# Patient Record
Sex: Female | Born: 1975 | Race: White | Hispanic: No | Marital: Married | Smoking: Never smoker
Health system: Southern US, Community
[De-identification: ages and names within clinical notes are randomized; demographics above are authoritative.]

## PROBLEM LIST (undated history)

## (undated) DIAGNOSIS — R112 Nausea with vomiting, unspecified: Secondary | ICD-10-CM

## (undated) DIAGNOSIS — F329 Major depressive disorder, single episode, unspecified: Secondary | ICD-10-CM

## (undated) DIAGNOSIS — F909 Attention-deficit hyperactivity disorder, unspecified type: Secondary | ICD-10-CM

## (undated) DIAGNOSIS — G43909 Migraine, unspecified, not intractable, without status migrainosus: Secondary | ICD-10-CM

## (undated) DIAGNOSIS — J45909 Unspecified asthma, uncomplicated: Secondary | ICD-10-CM

## (undated) DIAGNOSIS — F419 Anxiety disorder, unspecified: Secondary | ICD-10-CM

## (undated) DIAGNOSIS — Z9889 Other specified postprocedural states: Secondary | ICD-10-CM

## (undated) DIAGNOSIS — F32A Depression, unspecified: Secondary | ICD-10-CM

## (undated) DIAGNOSIS — F319 Bipolar disorder, unspecified: Secondary | ICD-10-CM

## (undated) HISTORY — PX: BREAST SURGERY: SHX581

## (undated) HISTORY — PX: CHOLECYSTECTOMY: SHX55

---

## 2012-01-22 ENCOUNTER — Emergency Department (HOSPITAL_COMMUNITY)
Admission: EM | Admit: 2012-01-22 | Discharge: 2012-01-22 | Disposition: A | Discharge status: Home / Self Care | Payer: BC Managed Care – PPO | Attending: Emergency Medicine | Admitting: Emergency Medicine

## 2012-01-22 ENCOUNTER — Emergency Department (HOSPITAL_COMMUNITY): Payer: BC Managed Care – PPO

## 2012-01-22 ENCOUNTER — Encounter (HOSPITAL_COMMUNITY): Payer: Self-pay | Admitting: *Deleted

## 2012-01-22 DIAGNOSIS — B9789 Other viral agents as the cause of diseases classified elsewhere: Secondary | ICD-10-CM | POA: Insufficient documentation

## 2012-01-22 DIAGNOSIS — Z8679 Personal history of other diseases of the circulatory system: Secondary | ICD-10-CM | POA: Insufficient documentation

## 2012-01-22 DIAGNOSIS — IMO0001 Reserved for inherently not codable concepts without codable children: Secondary | ICD-10-CM | POA: Insufficient documentation

## 2012-01-22 DIAGNOSIS — B349 Viral infection, unspecified: Secondary | ICD-10-CM

## 2012-01-22 DIAGNOSIS — Z79899 Other long term (current) drug therapy: Secondary | ICD-10-CM | POA: Insufficient documentation

## 2012-01-22 DIAGNOSIS — R55 Syncope and collapse: Secondary | ICD-10-CM | POA: Insufficient documentation

## 2012-01-22 DIAGNOSIS — Z8659 Personal history of other mental and behavioral disorders: Secondary | ICD-10-CM | POA: Insufficient documentation

## 2012-01-22 DIAGNOSIS — R404 Transient alteration of awareness: Secondary | ICD-10-CM | POA: Insufficient documentation

## 2012-01-22 DIAGNOSIS — R42 Dizziness and giddiness: Secondary | ICD-10-CM | POA: Insufficient documentation

## 2012-01-22 DIAGNOSIS — Z3202 Encounter for pregnancy test, result negative: Secondary | ICD-10-CM | POA: Insufficient documentation

## 2012-01-22 HISTORY — DX: Major depressive disorder, single episode, unspecified: F32.9

## 2012-01-22 HISTORY — DX: Migraine, unspecified, not intractable, without status migrainosus: G43.909

## 2012-01-22 HISTORY — DX: Depression, unspecified: F32.A

## 2012-01-22 HISTORY — DX: Anxiety disorder, unspecified: F41.9

## 2012-01-22 LAB — COMPREHENSIVE METABOLIC PANEL
ALT: 20 U/L (ref 0–35)
AST: 17 U/L (ref 0–37)
Albumin: 3.4 g/dL — ABNORMAL LOW (ref 3.5–5.2)
Alkaline Phosphatase: 36 U/L — ABNORMAL LOW (ref 39–117)
BUN: 8 mg/dL (ref 6–23)
CO2: 22 mEq/L (ref 19–32)
Calcium: 8.9 mg/dL (ref 8.4–10.5)
Chloride: 106 mEq/L (ref 96–112)
Creatinine, Ser: 0.75 mg/dL (ref 0.50–1.10)
GFR calc Af Amer: >90 mL/min (ref >90)
GFR calc non Af Amer: >90 mL/min (ref >90)
Glucose, Bld: 99 mg/dL (ref 70–99)
Potassium: 3.5 mEq/L (ref 3.5–5.1)
Sodium: 138 mEq/L (ref 135–145)
Total Bilirubin: 0.2 mg/dL — ABNORMAL LOW (ref 0.3–1.2)
Total Protein: 6.2 g/dL (ref 6.0–8.3)

## 2012-01-22 LAB — URINALYSIS, ROUTINE W REFLEX MICROSCOPIC
Bilirubin Urine: NEGATIVE
Glucose, UA: NEGATIVE mg/dL
Hgb urine dipstick: NEGATIVE
Ketones, ur: NEGATIVE mg/dL
Leukocytes, UA: NEGATIVE
Nitrite: NEGATIVE
Protein, ur: NEGATIVE mg/dL
Specific Gravity, Urine: 1.025 (ref 1.005–1.030)
Urobilinogen, UA: 0.2 mg/dL (ref 0.0–1.0)
pH: 6 (ref 5.0–8.0)

## 2012-01-22 LAB — CBC WITH DIFFERENTIAL/PLATELET
Basophils Absolute: 0 10*3/uL (ref 0.0–0.1)
Basophils Relative: 1 % (ref 0–1)
Eosinophils Absolute: 0.3 10*3/uL (ref 0.0–0.7)
Eosinophils Relative: 5 % (ref 0–5)
HCT: 40.6 % (ref 36.0–46.0)
Hemoglobin: 14 g/dL (ref 12.0–15.0)
Lymphocytes Relative: 33 % (ref 12–46)
Lymphs Abs: 2 10*3/uL (ref 0.7–4.0)
MCH: 31.3 pg (ref 26.0–34.0)
MCHC: 34.5 g/dL (ref 30.0–36.0)
MCV: 90.8 fL (ref 78.0–100.0)
Monocytes Absolute: 0.4 10*3/uL (ref 0.1–1.0)
Monocytes Relative: 7 % (ref 3–12)
Neutro Abs: 3.2 10*3/uL (ref 1.7–7.7)
Neutrophils Relative %: 54 % (ref 43–77)
Platelets: 225 10*3/uL (ref 150–400)
RBC: 4.47 MIL/uL (ref 3.87–5.11)
RDW: 12.7 % (ref 11.5–15.5)
WBC: 6 10*3/uL (ref 4.0–10.5)

## 2012-01-22 LAB — D-DIMER, QUANTITATIVE: D-Dimer, Quant: 0.55 ug/mL-FEU — ABNORMAL HIGH (ref 0.00–0.48)

## 2012-01-22 LAB — TROPONIN I: Troponin I: <0.3 ng/mL (ref <0.30)

## 2012-01-22 LAB — LIPASE, BLOOD: Lipase: 38 U/L (ref 11–59)

## 2012-01-22 LAB — PREGNANCY, URINE: Preg Test, Ur: NEGATIVE

## 2012-01-22 LAB — POCT PREGNANCY, URINE: Preg Test, Ur: NEGATIVE

## 2012-01-22 MED ORDER — PROMETHAZINE HCL 25 MG PO TABS: 25.0000 mg | ORAL_TABLET | Freq: Four times a day (QID) | ORAL | Status: DC | PRN

## 2012-01-22 MED ORDER — SODIUM CHLORIDE 0.9 % IV SOLN
INTRAVENOUS | Status: DC
  Administered 2012-01-22: 21:00:00 via INTRAVENOUS

## 2012-01-22 MED ORDER — SODIUM CHLORIDE 0.9 % IV BOLUS (SEPSIS)
1000.0000 mL | Freq: Once | INTRAVENOUS | Status: AC
  Administered 2012-01-22: 1000 mL via INTRAVENOUS

## 2012-01-22 MED ORDER — KETOROLAC TROMETHAMINE 30 MG/ML IJ SOLN
30.0000 mg | Freq: Once | INTRAMUSCULAR | Status: AC
  Administered 2012-01-22: 30 mg via INTRAVENOUS
  Filled 2012-01-22: qty 1

## 2012-01-22 MED ORDER — ONDANSETRON HCL 4 MG PO TABS: 4.0000 mg | ORAL_TABLET | Freq: Three times a day (TID) | ORAL | Status: AC | PRN

## 2012-01-22 MED ORDER — IOHEXOL 350 MG/ML SOLN
100.0000 mL | Freq: Once | INTRAVENOUS | Status: AC | PRN
  Administered 2012-01-22: 100 mL via INTRAVENOUS

## 2012-01-22 MED ORDER — PROMETHAZINE HCL 25 MG/ML IJ SOLN
12.5000 mg | Freq: Once | INTRAMUSCULAR | Status: AC
  Administered 2012-01-22: 12.5 mg via INTRAVENOUS
  Filled 2012-01-22: qty 1

## 2012-01-22 NOTE — ED Notes (Signed)
Discharge instructions reviewed with pt, questions answered. Pt verbalized understanding.  

## 2012-01-22 NOTE — ED Provider Notes (Signed)
History     CSN: [id number]  Arrival date & time 01/22/12  1831   First MD Initiated Contact with Patient 01/22/12 1844      Chief Complaint  Patient presents with  . Near Syncope     HPI Pt was seen at 1930.   Per pt, c/o gradual onset and persistence of constant lightheadedness and near syncope for the past 4 days.  Has been associated with one brief episode of syncope that occurred today.  Also c/o gradual onset and persistence of constant generalized body aches/fatigue, intermittent usual migraine headache, and several episodes of N/V/D for the past 4 days.  Describes the headache as per her usual chronic migraine headache pain pattern for many years.  Denies headache was sudden or maximal in onset or at any time. Denies having headache before or at the time of syncopal episode. Denies visual changes, no focal motor weakness, no tingling/numbness in extremities, no fevers, no neck pain, no rash. Denies black or blood in stools or emesis, no back pain, no CP/palpitations, no SOB/cough, no abd pain, no seizure activity, no incont/retention of bowel/bladder.     Past Medical History  Diagnosis Date  . Anxiety   . Depression   . Migraine headache     Past Surgical History  Procedure Date  . Breast surgery     breast biopsy     History  Substance Use Topics  . Smoking status: Never Smoker   . Smokeless tobacco: Not on file  . Alcohol Use: Yes     Comment: occasional     Review of Systems ROS: Statement: All systems negative except as marked or noted in the HPI; Constitutional: Negative for fever and chills. ; ; Eyes: Negative for eye pain, redness and discharge. ; ; ENMT: Negative for ear pain, hoarseness, nasal congestion, sinus pressure and sore throat. ; ; Cardiovascular: Negative for chest pain, palpitations, diaphoresis, dyspnea and peripheral edema. ; ; Respiratory: Negative for cough, wheezing and stridor. ; ; Gastrointestinal: +N/V/D. Negative for abdominal pain,  blood in stool, hematemesis, jaundice and rectal bleeding. . ; ; Genitourinary: Negative for dysuria, flank pain and hematuria. ; ; Musculoskeletal: Negative for back pain and neck pain. Negative for swelling and trauma.; ; Skin: Negative for pruritus, rash, abrasions, blisters, bruising and skin lesion.; ; Neuro: Negative for neck stiffness. Negative for weakness, altered level of consciousness , altered mental status, extremity weakness, paresthesias, involuntary movement, seizure and +migraine headache, lightheadedness, syncope.     Allergies  Codeine  Home Medications   Current Outpatient Rx  Name  Route  Sig  Dispense  Refill  . ALPRAZOLAM 1 MG PO TABS   Oral   Take 1 mg by mouth 3 (three) times daily.         . DULOXETINE HCL 60 MG PO CPEP   Oral   Take 60 mg by mouth daily.         Marland Kitchen LOPERAMIDE HCL 2 MG PO TABS   Oral   Take 2 mg by mouth 4 (four) times daily as needed. Diarrhea         . COLD AND FLU PO   Oral   Take 2 tablets by mouth every 6 (six) hours as needed. Cold and flu         . ONDANSETRON HCL 4 MG PO TABS   Oral   Take 1 tablet (4 mg total) by mouth every 8 (eight) hours as needed for nausea.   6 tablet  0     BP 114/66  Pulse 70  Temp 98.4 F (36.9 C) (Oral)  Resp 20  Ht 5\' 3"  (1.6 m)  Wt 285 lb (129.275 kg)  BMI 50.49 kg/m2  SpO2 100%  LMP 12/26/2011  Physical Exam 1935: Physical examination:  Nursing notes reviewed; Vital signs and O2 SAT reviewed;  Constitutional: Well developed, Well nourished, Well hydrated, In no acute distress; Head:  Normocephalic, atraumatic; Eyes: EOMI, PERRL, No scleral icterus; ENMT: Mouth and pharynx normal, Mucous membranes moist; Neck: Supple, Full range of motion, No lymphadenopathy; Cardiovascular: Regular rate and rhythm, No murmur, rub, or gallop; Respiratory: Breath sounds clear & equal bilaterally, No rales, rhonchi, wheezes.  Speaking full sentences with ease, Normal respiratory effort/excursion;  Chest: Nontender, Movement normal; Abdomen: Soft, Nontender, Nondistended, Normal bowel sounds; Genitourinary: No CVA tenderness; Extremities: Pulses normal, No tenderness, No edema, No calf edema or asymmetry.; Neuro: AA&Ox3, Major CN grossly intact.  No facial droop. Speech clear. No gross focal motor or sensory deficits in extremities.; Skin: Color normal, Warm, Dry.   ED Course  Procedures    MDM  MDM Reviewed: previous chart, nursing note and vitals Interpretation: ECG, labs, x-ray and CT scan    Date: 01/22/2012  Rate: 83  Rhythm: normal sinus rhythm and sinus arrhythmia  QRS Axis: normal  Intervals: normal  ST/T Wave abnormalities: normal  Conduction Disutrbances:none  Narrative Interpretation:   Old EKG Reviewed: none available.  Results for orders placed during the hospital encounter of 01/22/12  CBC WITH DIFFERENTIAL      Component Value Range   WBC 6.0  4.0 - 10.5 K/uL   RBC 4.47  3.87 - 5.11 MIL/uL   Hemoglobin 14.0  12.0 - 15.0 g/dL   HCT 45.4  09.8 - 11.9 %   MCV 90.8  78.0 - 100.0 fL   MCH 31.3  26.0 - 34.0 pg   MCHC 34.5  30.0 - 36.0 g/dL   RDW 14.7  82.9 - 56.2 %   Platelets 225  150 - 400 K/uL   Neutrophils Relative 54  43 - 77 %   Neutro Abs 3.2  1.7 - 7.7 K/uL   Lymphocytes Relative 33  12 - 46 %   Lymphs Abs 2.0  0.7 - 4.0 K/uL   Monocytes Relative 7  3 - 12 %   Monocytes Absolute 0.4  0.1 - 1.0 K/uL   Eosinophils Relative 5  0 - 5 %   Eosinophils Absolute 0.3  0.0 - 0.7 K/uL   Basophils Relative 1  0 - 1 %   Basophils Absolute 0.0  0.0 - 0.1 K/uL  PREGNANCY, URINE      Component Value Range   Preg Test, Ur NEGATIVE  NEGATIVE  URINALYSIS, ROUTINE W REFLEX MICROSCOPIC      Component Value Range   Color, Urine YELLOW  YELLOW   APPearance CLEAR  CLEAR   Specific Gravity, Urine 1.025  1.005 - 1.030   pH 6.0  5.0 - 8.0   Glucose, UA NEGATIVE  NEGATIVE mg/dL   Hgb urine dipstick NEGATIVE  NEGATIVE   Bilirubin Urine NEGATIVE  NEGATIVE   Ketones,  ur NEGATIVE  NEGATIVE mg/dL   Protein, ur NEGATIVE  NEGATIVE mg/dL   Urobilinogen, UA 0.2  0.0 - 1.0 mg/dL   Nitrite NEGATIVE  NEGATIVE   Leukocytes, UA NEGATIVE  NEGATIVE  TROPONIN I      Component Value Range   Troponin I <0.30  <0.30 ng/mL  D-DIMER, QUANTITATIVE  Component Value Range   D-Dimer, Quant 0.55 (*) 0.00 - 0.48 ug/mL-FEU  COMPREHENSIVE METABOLIC PANEL      Component Value Range   Sodium 138  135 - 145 mEq/L   Potassium 3.5  3.5 - 5.1 mEq/L   Chloride 106  96 - 112 mEq/L   CO2 22  19 - 32 mEq/L   Glucose, Bld 99  70 - 99 mg/dL   BUN 8  6 - 23 mg/dL   Creatinine, Ser 4.09  0.50 - 1.10 mg/dL   Calcium 8.9  8.4 - 81.1 mg/dL   Total Protein 6.2  6.0 - 8.3 g/dL   Albumin 3.4 (*) 3.5 - 5.2 g/dL   AST 17  0 - 37 U/L   ALT 20  0 - 35 U/L   Alkaline Phosphatase 36 (*) 39 - 117 U/L   Total Bilirubin 0.2 (*) 0.3 - 1.2 mg/dL   GFR calc non Af Amer >90  >90 mL/min   GFR calc Af Amer >90  >90 mL/min  LIPASE, BLOOD      Component Value Range   Lipase 38  11 - 59 U/L  POCT PREGNANCY, URINE      Component Value Range   Preg Test, Ur NEGATIVE  NEGATIVE   Dg Chest 2 View 01/22/2012  *RADIOLOGY REPORT*  Clinical Data: Weakness.  CHEST - 2 VIEW  Comparison: None.  Findings: Two views of the chest demonstrate clear lungs.  Heart and mediastinum are within normal limits. The trachea is midline. Bony thorax is intact.  IMPRESSION: No acute cardiopulmonary disease.   Original Report Authenticated By: Richarda Overlie, M.D.    Ct Head Wo Contrast 01/22/2012  *RADIOLOGY REPORT*  Clinical Data: Dizziness; near-syncope.  CT HEAD WITHOUT CONTRAST  Technique:  Contiguous axial images were obtained from the base of the skull through the vertex without contrast.  Comparison: None.  Findings: There is no evidence of acute infarction, mass lesion, or intra- or extra-axial hemorrhage on CT.  The posterior fossa, including the cerebellum, brainstem and fourth ventricle, is within normal limits.  The third  and lateral ventricles, and basal ganglia are unremarkable in appearance.  The cerebral hemispheres are symmetric in appearance, with normal gray- white differentiation.  No mass effect or midline shift is seen.  There is no evidence of fracture; visualized osseous structures are unremarkable in appearance.  The visualized portions of the orbits are within normal limits.  Mild mucosal thickening is noted within the right maxillary sinus; the remaining paranasal sinuses and mastoid air cells are well-aerated.  No significant soft tissue abnormalities are seen.  IMPRESSION:  1.  No acute intracranial pathology seen on CT. 2.  Mild mucosal thickening within the right maxillary sinus.   Original Report Authenticated By: Tonia Ghent, M.D.    Ct Angio Chest Pe W/cm &/or Wo Cm 01/22/2012  *RADIOLOGY REPORT*  Clinical Data: Shortness of breath.  Rule out pulmonary embolism.  CT ANGIOGRAPHY CHEST  Technique:  Multidetector CT imaging of the chest using the standard protocol during bolus administration of intravenous contrast. Multiplanar reconstructed images including MIPs were obtained and reviewed to evaluate the vascular anatomy.  Contrast: OMNIPAQUE IOHEXOL 350 MG/ML SOLN  Comparison: Chest radiograph 01/22/2012  Findings: No evidence for a pulmonary embolism.  Soft tissue in the anterior mediastinum is consistent with residual thymic tissue. There is no significant pericardial or pleural fluid.  No evidence for chest lymphadenopathy.  There is a peripherally calcified 2.1 cm gallstone.  There may  be a small hiatal hernia.  The trachea and mainstem bronchi are patent.  There is a 4 mm noncalcified nodule in the left lower lobe on sequence #6, image 47.  A small area of thickening or nodular along the right major fissure on image 43.  No evidence for airspace disease or consolidation.  No acute bony abnormality.  IMPRESSION: Negative for pulmonary embolism.  Cholelithiasis.  No acute chest findings.  4 mm nodule  in the left lower lobe. If the patient is at high risk for bronchogenic carcinoma, follow-up chest CT at 1 year is recommended.  If the patient is at low risk, no follow-up is needed.  This recommendation follows the consensus statement: Guidelines for Management of Small Pulmonary Nodules Detected on CT Scans:  A Statement from the Fleischner Society as published in Radiology 2005; 237:395-400.   Original Report Authenticated By: Richarda Overlie, M.D.       2150:  Pt not orthostatic per VS, but stated she felt "weak" and "dizzy" per ED Tech when stood up.  IVF, toradol and anti-emetic given with improvement in symptoms.  Pt has tol PO well while in the ED without N/V.  No stooling while in the ED.  Abd remains benign, neuro exam unchanged, VSS.  Doubt SAH as cause for syncope, given no headache before or at time of syncope, intact and unchanged neuro exam, as well as pt's description of her headache is per her chronic headache pattern. Wants to go home now. Requesting work note and "something stronger than tylenol and motrin for my body aches."  Explained that I would not give her a narcotic pain med that may potentially make her more lightheaded and fatigued.  Pt and family verb understanding.  Dx and testing d/w pt and family.  Questions answered.  Verb understanding, agreeable to d/c home with outpt f/u.             Laray Anger, DO 01/24/12 1340

## 2012-01-22 NOTE — ED Notes (Signed)
Dizziness and black out spells x 4 days, states today has been worse.  Pt also reporting intermittent n/v/d x 2 days.  C/o migraines.

## 2012-01-22 NOTE — ED Notes (Signed)
Answered pt's questions regarding lab work and radiographic studies.

## 2012-01-22 NOTE — ED Notes (Signed)
During orthostatics the patient said she was dizzy and her legs felt like they wanted to give out while standing. When she laid back down she said  the room was spinning.

## 2012-01-24 ENCOUNTER — Encounter (HOSPITAL_COMMUNITY): Payer: Self-pay | Admitting: Emergency Medicine

## 2014-08-02 IMAGING — CT CT ANGIO CHEST
2 of 6 series · 6 of 36 positions shown · IV contrast (Omnipaque 300)
Comparison: Chest radiograph 01/22/2012

CLINICAL DATA: Shortness of breath.  Rule out pulmonary embolism.

CT ANGIOGRAPHY CHEST
TECHNIQUE: Multidetector CT imaging of the chest using the
standard protocol during bolus administration of intravenous
contrast. Multiplanar reconstructed images including MIPs were
obtained and reviewed to evaluate the vascular anatomy.
Contrast: 100mL OMNIPAQUE IOHEXOL 350 MG/ML SOLN

[Series 5: pe 3.0 b40f · axial · 0.74mm/px · z∈[-290,-98]mm · 5 of 98 slices shown]
[im 17/98  lung]
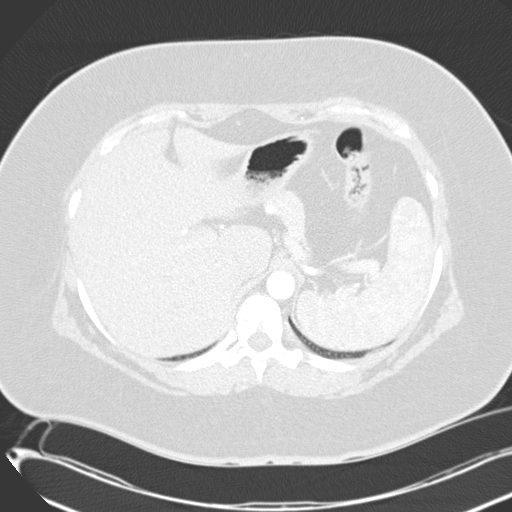
[im 33/98  mediastinal]
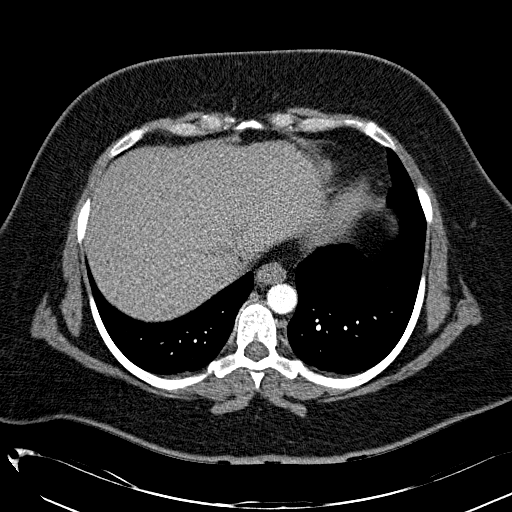
[im 49/98  lung]
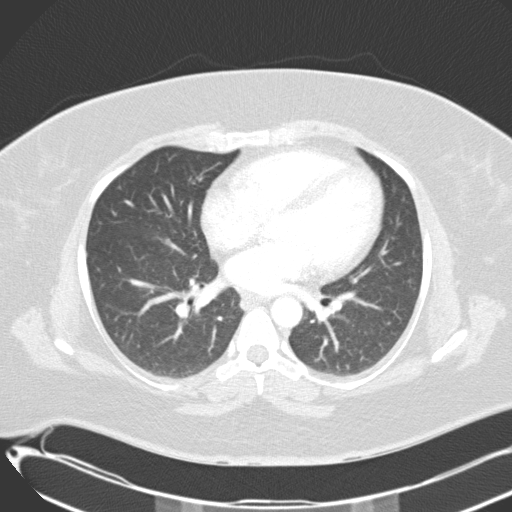
[im 65/98  mediastinal]
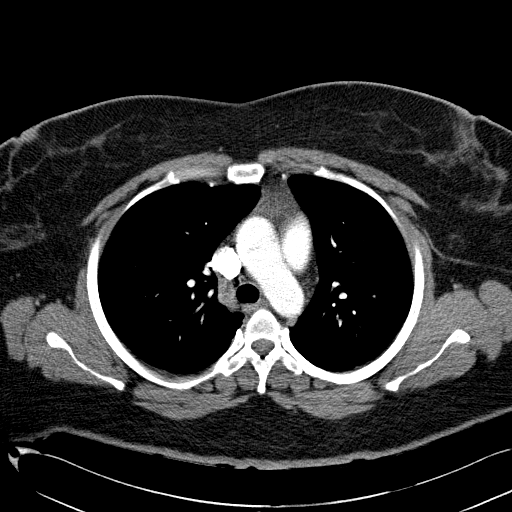
[im 81/98  lung]
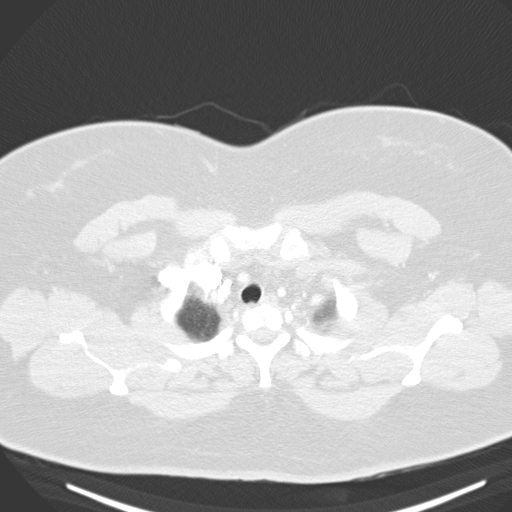

[Series 7: mpr coronal pe 3mm · coronal · 0.59mm/px · 1 of 95 slices shown]
[im 48/95  mediastinal]
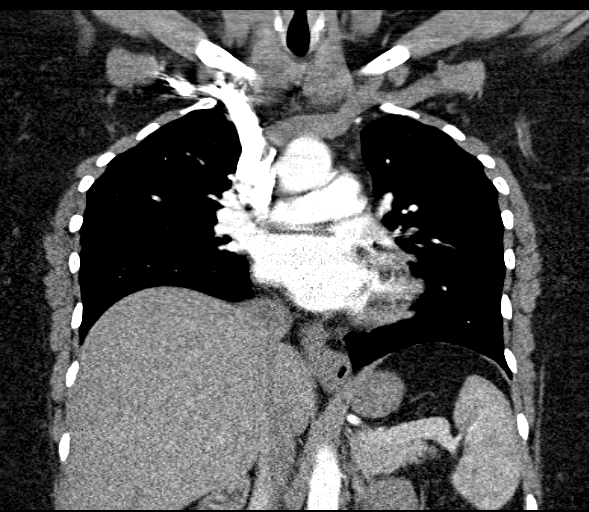

[6 of 36 positions shown; findings below may reference images not displayed]

FINDINGS: No evidence for a pulmonary embolism.  Soft tissue in the
anterior mediastinum is consistent with residual thymic tissue.
There is no significant pericardial or pleural fluid.  No evidence
for chest lymphadenopathy.  There is a peripherally calcified
cm gallstone.  There may be a small hiatal hernia.

The trachea and mainstem bronchi are patent.  There is a 4 mm
noncalcified nodule in the left lower lobe on sequence #6, image
47.  A small area of thickening or nodular along the right major
fissure on image 43.  No evidence for airspace disease or
consolidation.  No acute bony abnormality.
IMPRESSION: Negative for pulmonary embolism.

Cholelithiasis.

No acute chest findings.

4 mm nodule in the left lower lobe. If the patient is at high risk
for bronchogenic carcinoma, follow-up chest CT at 1 year is
recommended.  If the patient is at low risk, no follow-up is
needed.  This recommendation follows the consensus statement:
Guidelines for Management of Small Pulmonary Nodules Detected on CT
Scans:  A Statement from the [HOSPITAL] as published in

## 2014-08-02 IMAGING — CT CT HEAD W/O CM
1 series · 15 of 30 positions shown, 19 images · non-contrast
Comparison: None.

CLINICAL DATA: Dizziness; near-syncope.

CT HEAD WITHOUT CONTRAST
TECHNIQUE: Contiguous axial images were obtained from the base of
the skull through the vertex without contrast.

[Series 2: headseq 4.8 h37s · axial · 0.43mm/px · z∈[+110,+245]mm · 15 of 30 slices shown, 19 images]
[im 2/30  brain]
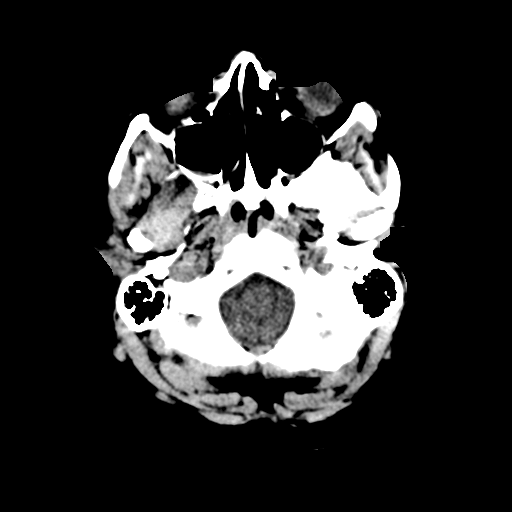
[im 2/30  bone]
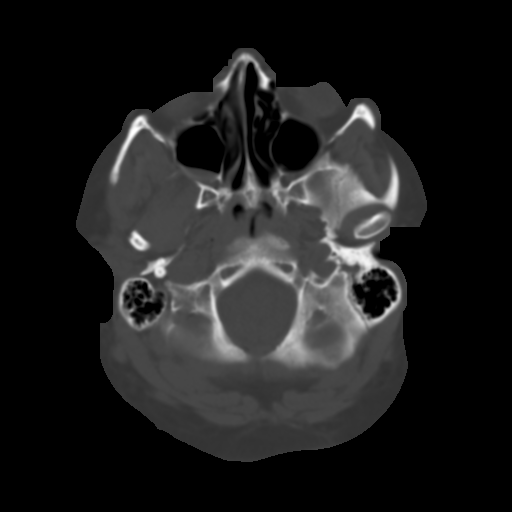
[im 4/30  brain]
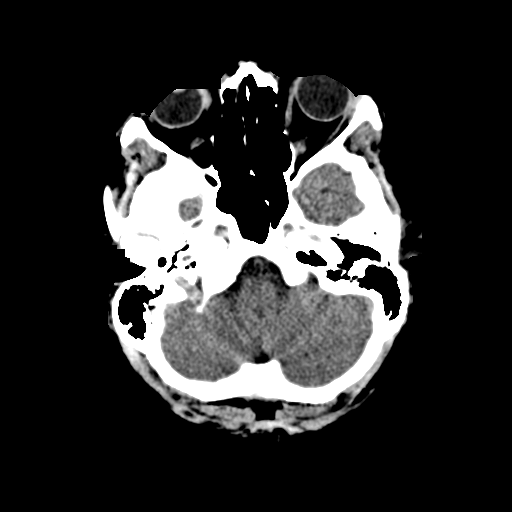
[im 6/30  brain]
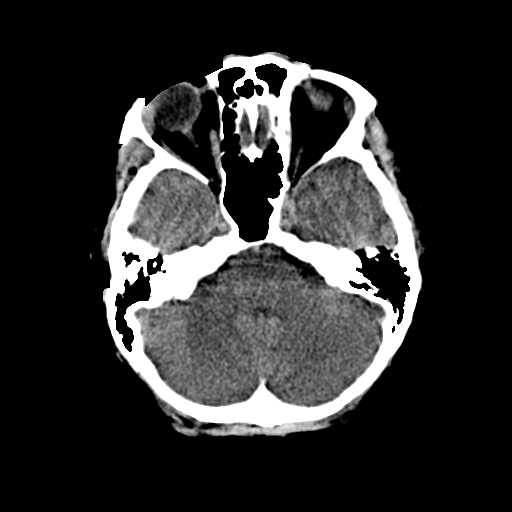
[im 8/30  brain]
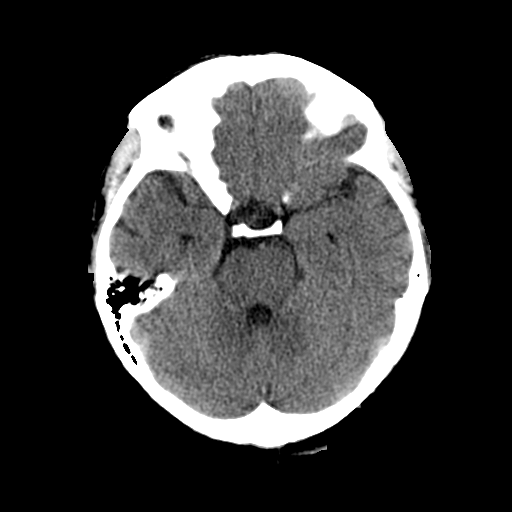
[im 10/30  brain]
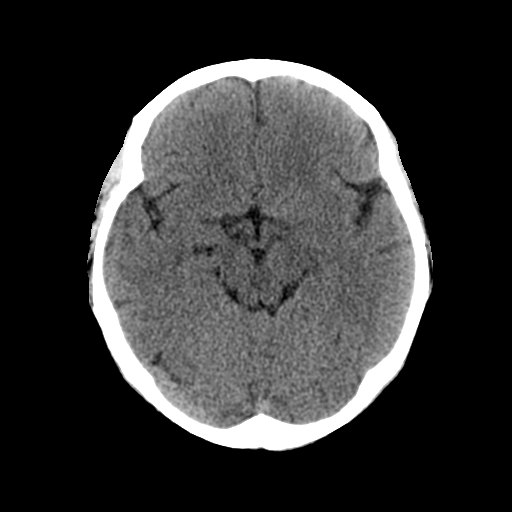
[im 10/30  bone]
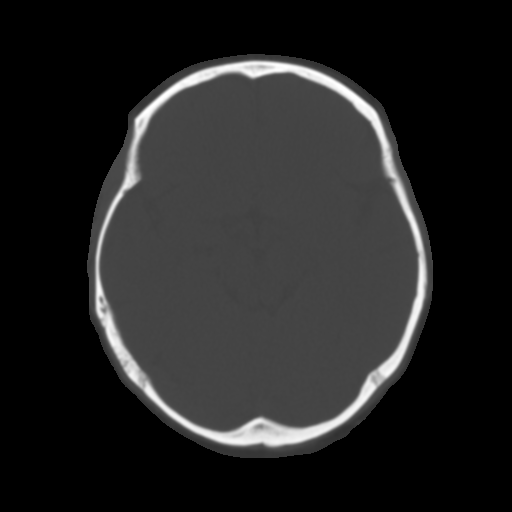
[im 12/30  brain]
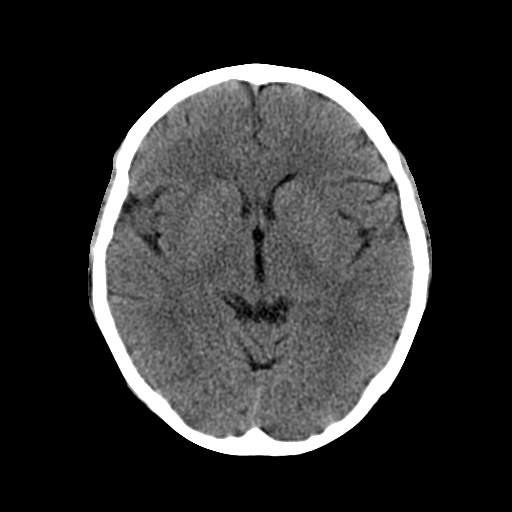
[im 14/30  brain]
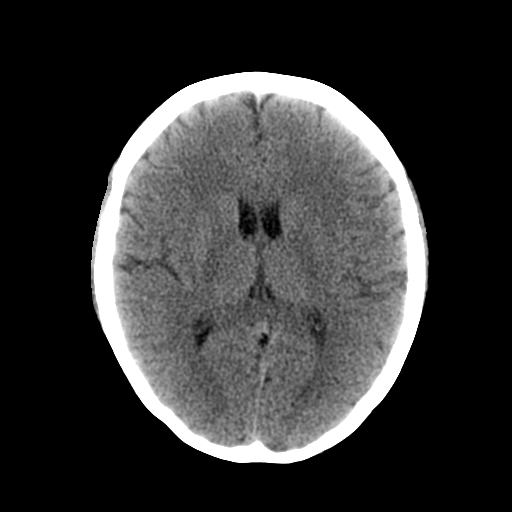
[im 16/30  brain]
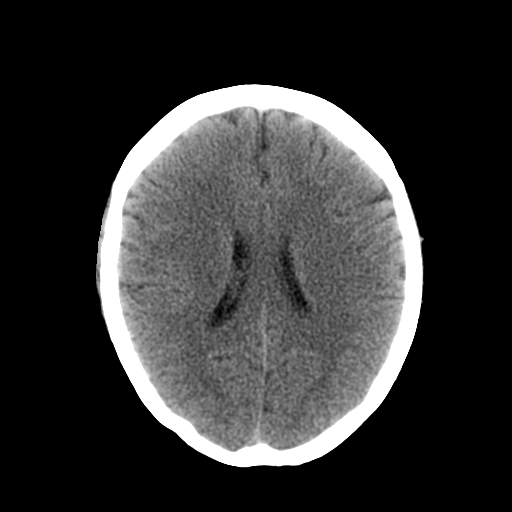
[im 17/30  brain]
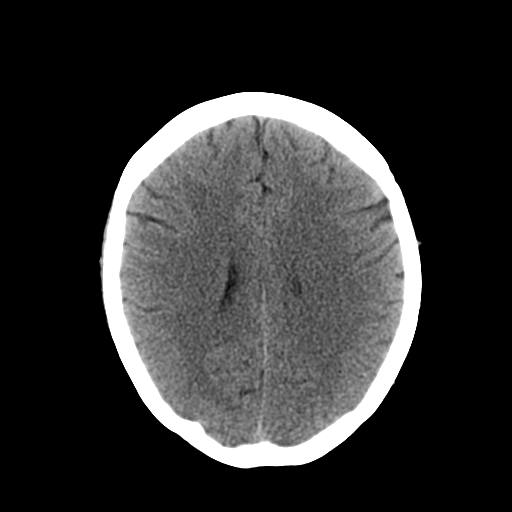
[im 17/30  bone]
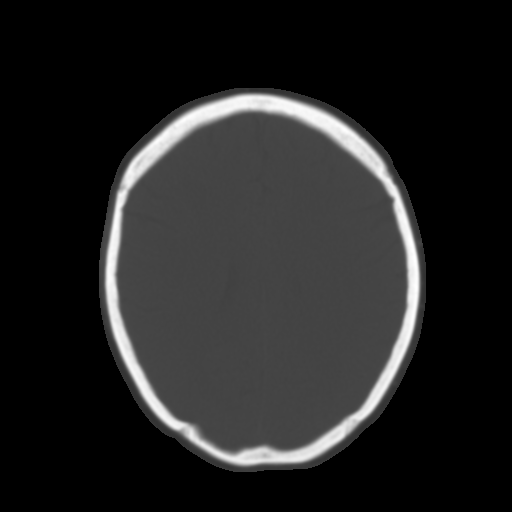
[im 19/30  brain]
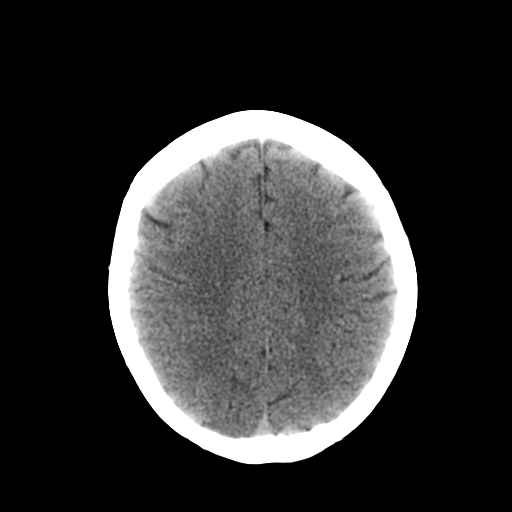
[im 21/30  brain]
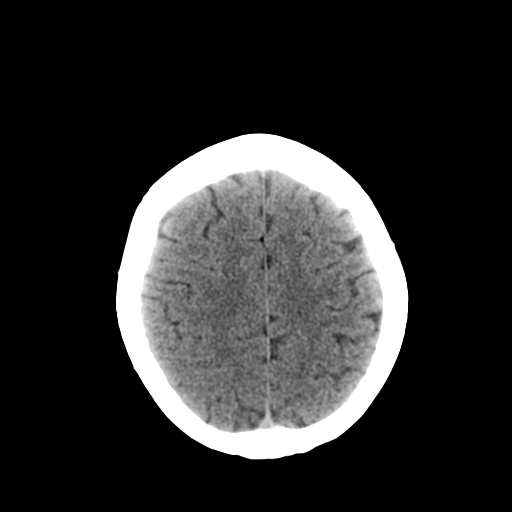
[im 23/30  brain]
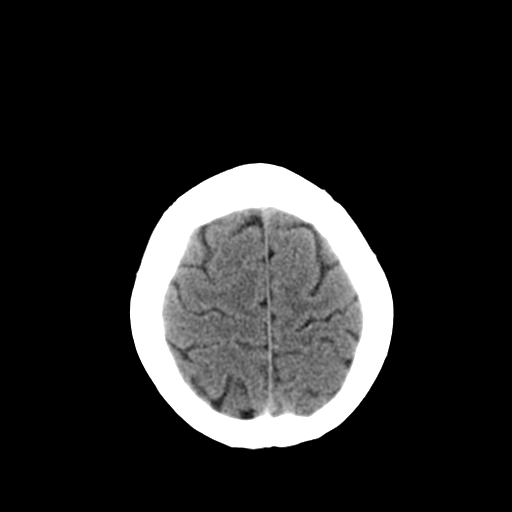
[im 25/30  brain]
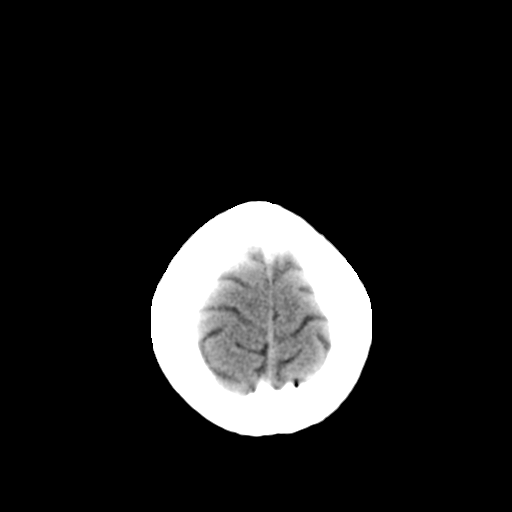
[im 25/30  bone]
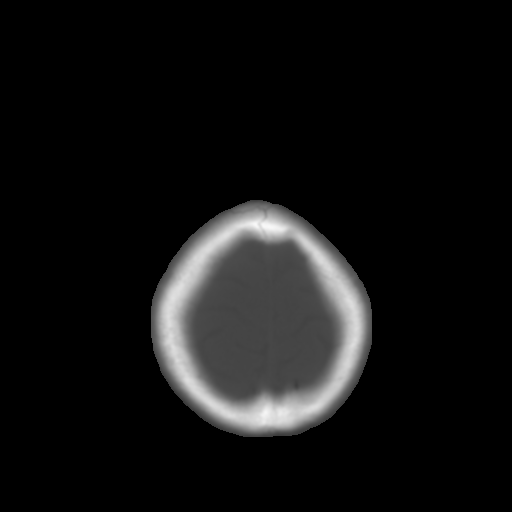
[im 27/30  brain]
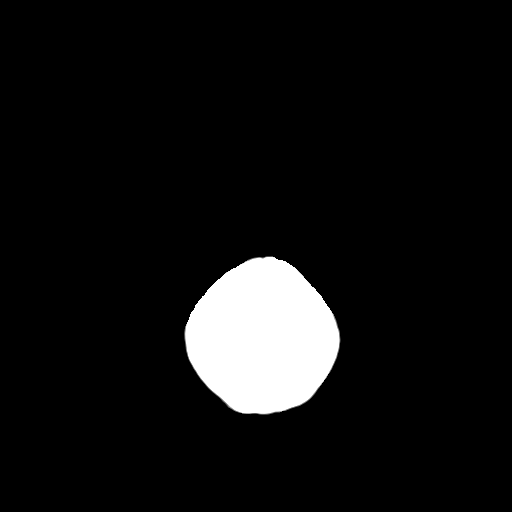
[im 29/30  brain]
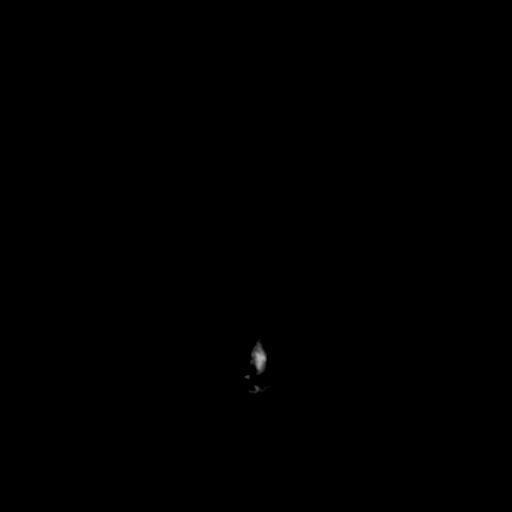

[15 of 30 positions shown; findings below may reference images not displayed]

FINDINGS: There is no evidence of acute infarction, mass lesion, or
intra- or extra-axial hemorrhage on CT.

The posterior fossa, including the cerebellum, brainstem and fourth
ventricle, is within normal limits.  The third and lateral
ventricles, and basal ganglia are unremarkable in appearance.  The
cerebral hemispheres are symmetric in appearance, with normal gray-
white differentiation.  No mass effect or midline shift is seen.

There is no evidence of fracture; visualized osseous structures are
unremarkable in appearance.  The visualized portions of the orbits
are within normal limits.  Mild mucosal thickening is noted within
the right maxillary sinus; the remaining paranasal sinuses and
mastoid air cells are well-aerated.  No significant soft tissue
abnormalities are seen.
IMPRESSION: 1.  No acute intracranial pathology seen on CT.
2.  Mild mucosal thickening within the right maxillary sinus.

## 2019-04-26 ENCOUNTER — Other Ambulatory Visit (HOSPITAL_COMMUNITY): Payer: Self-pay | Admitting: Orthopedic Surgery

## 2019-05-02 NOTE — Progress Notes (Signed)
[facility] [address] [place], [place] - [address] [place] [address] Phone: [phone] Fax: [phone]  [facility] [place], [place] - [address] [place] [address] Phone: [phone] Fax: [phone]    Your procedure is scheduled on Tuesday, May 4th.  Report to [facility] Main Entrance "A" at 5:30 A.M., and check in at the Admitting office.  Call this number if you have problems the morning of surgery:  [phone]  Call [phone] if you have any questions prior to your surgery date Monday-Friday 8am-4pm   Remember:  Do not eat after midnight the night before your surgery  You may drink clear liquids until 4:30 A.M. the morning of your surgery.   Clear liquids allowed are: Water, Non-Citrus Juices (without pulp), Carbonated Beverages, Clear Tea, Black Coffee Only, and Gatorade   Enhanced Recovery after Surgery for Orthopedics Enhanced Recovery after Surgery is a protocol used to improve the stress on your body and your recovery after surgery.  Patient Instructions  . The night before surgery:  o No food after midnight. ONLY clear liquids after midnight  .  Marland Kitchen The day of surgery (if you do NOT have diabetes):  o Drink ONE (1) Pre-Surgery Clear Ensure by 4:30 A.M. the morning of surgery. DO NOT SIP. If possible, drink in ONE setting.  o This drink was given to you during your hospital  pre-op appointment visit. o Nothing else to drink after completing the  Pre-Surgery Clear Ensure.         If you have questions, please contact your surgeon's office.   Take these medicines the morning of surgery with A SIP OF WATER  ALPRAZolam (XANAX) cholestyramine (QUESTRAN)      lamoTRIgine (LAMICTAL)  venlafaxine XR (EFFEXOR-XR)   If needed - ondansetron (ZOFRAN)   As of today, STOP taking any Aspirin (unless otherwise instructed by your surgeon) and Aspirin containing products, Aleve, Naproxen,  Ibuprofen, Motrin, Advil, Goody's, BC's, all herbal medications, fish oil, and all vitamins.                     Do not wear jewelry, make up, or nail polish            Do not wear lotions, powders, perfumes or deodorant.            Do not shave 48 hours prior to surgery.              Do not bring valuables to the hospital.            [facility] is not responsible for any belongings or valuables.  Do NOT Smoke (Tobacco/Vapping) or drink Alcohol 24 hours prior to your procedure If you use a CPAP at night, you may bring all equipment for your overnight stay.   Contacts, glasses, dentures or bridgework may not be worn into surgery.      For patients admitted to the hospital, discharge time will be determined by your treatment team.   Patients discharged the day of surgery will not be allowed to drive home, and someone needs to stay with them for 24 hours.  Special instructions:   [facility]- Preparing For Surgery  Before surgery, you can play an important role. Because skin is not sterile, your skin needs to be as free of germs as possible. You can reduce the number of germs on your skin by washing with CHG (chlorahexidine  gluconate) Soap before surgery.  CHG is an antiseptic cleaner which kills germs and bonds with the skin to continue killing germs even after washing.    Oral Hygiene is also important to reduce your risk of infection.  Remember - BRUSH YOUR TEETH THE MORNING OF SURGERY WITH YOUR REGULAR TOOTHPASTE  Please do not use if you have an allergy to CHG or antibacterial soaps. If your skin becomes reddened/irritated stop using the CHG.  Do not shave (including legs and underarms) for at least 48 hours prior to first CHG shower. It is OK to shave your face.  Please follow these instructions carefully.   1. Shower the NIGHT BEFORE SURGERY and the MORNING OF SURGERY with CHG Soap.   2. If you chose to wash your hair, wash your hair first as usual with your normal  shampoo.  3. After you shampoo, rinse your hair and body thoroughly to remove the shampoo.  4. Use CHG as you would any other liquid soap. You can apply CHG directly to the skin and wash gently with a scrungie or a clean washcloth.   5. Apply the CHG Soap to your body ONLY FROM THE NECK DOWN.  Do not use on open wounds or open sores. Avoid contact with your eyes, ears, mouth and genitals (private parts). Wash Face and genitals (private parts)  with your normal soap.   6. Wash thoroughly, paying special attention to the area where your surgery will be performed.  7. Thoroughly rinse your body with warm water from the neck down.  8. DO NOT shower/wash with your normal soap after using and rinsing off the CHG Soap.  9. Pat yourself dry with a CLEAN TOWEL.  10. Wear CLEAN PAJAMAS to bed the night before surgery, wear comfortable clothes the morning of surgery  11. Place CLEAN SHEETS on your bed the night of your first shower and DO NOT SLEEP WITH PETS.  Day of Surgery: Shower with CHG soap as instructed above. Do not apply any deodorants/lotions.  Please wear clean clothes to the hospital/surgery center.   Remember to brush your teeth WITH YOUR REGULAR TOOTHPASTE.   Please read over the following fact sheets that you were given.

## 2019-05-03 ENCOUNTER — Encounter (HOSPITAL_COMMUNITY): Payer: Self-pay

## 2019-05-03 ENCOUNTER — Other Ambulatory Visit (HOSPITAL_COMMUNITY)
Admission: RE | Admit: 2019-05-03 | Discharge: 2019-05-03 | Disposition: A | Discharge status: Home / Self Care | Payer: BC Managed Care – PPO | Source: Ambulatory Visit | Attending: Orthopedic Surgery | Admitting: Orthopedic Surgery

## 2019-05-03 ENCOUNTER — Other Ambulatory Visit: Payer: Self-pay

## 2019-05-03 ENCOUNTER — Encounter (HOSPITAL_COMMUNITY)
Admission: RE | Admit: 2019-05-03 | Discharge: 2019-05-03 | Disposition: A | Discharge status: Home / Self Care | Payer: BC Managed Care – PPO | Source: Ambulatory Visit | Attending: Orthopedic Surgery | Admitting: Orthopedic Surgery

## 2019-05-03 DIAGNOSIS — Z20822 Contact with and (suspected) exposure to covid-19: Secondary | ICD-10-CM | POA: Diagnosis not present

## 2019-05-03 DIAGNOSIS — F909 Attention-deficit hyperactivity disorder, unspecified type: Secondary | ICD-10-CM | POA: Diagnosis not present

## 2019-05-03 DIAGNOSIS — Z79899 Other long term (current) drug therapy: Secondary | ICD-10-CM | POA: Diagnosis not present

## 2019-05-03 DIAGNOSIS — F419 Anxiety disorder, unspecified: Secondary | ICD-10-CM | POA: Insufficient documentation

## 2019-05-03 DIAGNOSIS — Z01818 Encounter for other preprocedural examination: Secondary | ICD-10-CM | POA: Diagnosis not present

## 2019-05-03 DIAGNOSIS — F319 Bipolar disorder, unspecified: Secondary | ICD-10-CM | POA: Diagnosis not present

## 2019-05-03 DIAGNOSIS — M7661 Achilles tendinitis, right leg: Secondary | ICD-10-CM | POA: Diagnosis not present

## 2019-05-03 HISTORY — DX: Bipolar disorder, unspecified: F31.9

## 2019-05-03 HISTORY — DX: Other specified postprocedural states: Z98.890

## 2019-05-03 HISTORY — DX: Unspecified asthma, uncomplicated: J45.909

## 2019-05-03 HISTORY — DX: Nausea with vomiting, unspecified: R11.2

## 2019-05-03 HISTORY — DX: Attention-deficit hyperactivity disorder, unspecified type: F90.9

## 2019-05-03 LAB — BASIC METABOLIC PANEL
Anion gap: 9 (ref 5–15)
BUN: 7 mg/dL (ref 6–20)
CO2: 23 mmol/L (ref 22–32)
Calcium: 8.9 mg/dL (ref 8.9–10.3)
Chloride: 107 mmol/L (ref 98–111)
Creatinine, Ser: 0.79 mg/dL (ref 0.44–1.00)
GFR calc Af Amer: >60 mL/min (ref >60)
GFR calc non Af Amer: >60 mL/min (ref >60)
Glucose, Bld: 86 mg/dL (ref 70–99)
Potassium: 4.4 mmol/L (ref 3.5–5.1)
Sodium: 139 mmol/L (ref 135–145)

## 2019-05-03 LAB — CBC
HCT: 41.5 % (ref 36.0–46.0)
Hemoglobin: 12.9 g/dL (ref 12.0–15.0)
MCH: 27.5 pg (ref 26.0–34.0)
MCHC: 31.1 g/dL (ref 30.0–36.0)
MCV: 88.5 fL (ref 80.0–100.0)
Platelets: 345 10*3/uL (ref 150–400)
RBC: 4.69 MIL/uL (ref 3.87–5.11)
RDW: 13.2 % (ref 11.5–15.5)
WBC: 8.7 10*3/uL (ref 4.0–10.5)
nRBC: 0 % (ref 0.0–0.2)

## 2019-05-03 NOTE — Progress Notes (Signed)
PCP - Dr. Donnetta Hutching Cardiologist - denies Psychiatry - Deatra Robinson, FNP [facility] - Johna Sheriff, PA-C  PPM/ICD - denies  Chest x-ray - N/A EKG - N/A Stress Test - denies ECHO - denies Cardiac Cath - denies  Sleep Study - denies CPAP - N/A  Blood Thinner Instructions: N/A Aspirin Instructions: N/A  ERAS Protcol - Yes PRE-SURGERY Ensure or G2-   COVID TEST- Scheduled for today 05/03/2019 after PAT appointment. Patient verbalized Irven Easterly  Anesthesia review: YES, records requested from PCP's office   Patient denies shortness of breath, fever, cough and chest pain at PAT appointment  All instructions explained to the patient, with a verbal understanding of the material. Patient agrees to go over the instructions while at home for a better understanding. Patient also instructed to self quarantine after being tested for COVID-19. The opportunity to ask questions was provided.

## 2019-05-04 LAB — SARS CORONAVIRUS 2 (TAT 6-24 HRS): SARS Coronavirus 2: NEGATIVE

## 2019-05-06 MED ORDER — DEXTROSE 5 % IV SOLN
3.0000 g | INTRAVENOUS | Status: AC
  Administered 2019-05-07: 3 g via INTRAVENOUS
  Filled 2019-05-06: qty 3000
  Filled 2019-05-06: qty 3

## 2019-05-06 NOTE — Anesthesia Preprocedure Evaluation (Addendum)
Anesthesia Evaluation  Patient identified by MRN, date of birth, ID band Patient awake    Reviewed: Allergy & Precautions, NPO status , Patient's Chart, lab work & pertinent test results  History of Anesthesia Complications (+) PONV  Airway Mallampati: II  TM Distance: >3 FB Neck ROM: Full    Dental  (+) Partial Lower, Dental Advisory Given, Caps   Pulmonary neg pulmonary ROS,  05/03/2019 SARS coronavirus Neg   breath sounds clear to auscultation       Cardiovascular (-) anginanegative cardio ROS   Rhythm:Regular Rate:Normal     Neuro/Psych  Headaches, PSYCHIATRIC DISORDERS (ADHD) Anxiety Depression Bipolar Disorder    GI/Hepatic negative GI ROS, Neg liver ROS,   Endo/Other  Morbid obesity  Renal/GU negative Renal ROS     Musculoskeletal   Abdominal (+) + obese,   Peds  Hematology negative hematology ROS (+)   Anesthesia Other Findings   Reproductive/Obstetrics                            Anesthesia Physical Anesthesia Plan  ASA: III  Anesthesia Plan: General   Post-op Pain Management: GA combined w/ Regional for post-op pain   Induction: Intravenous  PONV Risk Score and Plan: 4 or greater and Scopolamine patch - Pre-op, Dexamethasone and Ondansetron  Airway Management Planned: Oral ETT  Additional Equipment:   Intra-op Plan:   Post-operative Plan: Extubation in OR  Informed Consent: I have reviewed the patients History and Physical, chart, labs and discussed the procedure including the risks, benefits and alternatives for the proposed anesthesia with the patient or authorized representative who has indicated his/her understanding and acceptance.     Dental advisory given  Plan Discussed with: CRNA and Surgeon  Anesthesia Plan Comments: (Plan routine monitors, GETA with adductor canal and popliteal blocks for post op analgesia)        Anesthesia Quick  Evaluation

## 2019-05-06 NOTE — Progress Notes (Signed)
Anesthesia Chart Review:  Case: [id number] Date/Time: 05/07/19 0715   Procedure: Right gastroc recession, haglund excision, Achiles tendon reconstruction (Right ) -   Anesthesia type: General   Pre-op diagnosis: Right Achilles tendonitis   [facility]: [facility] ROOM 09 / [facility]   Surgeons: Toni Arthurs, MD      DISCUSSION: Patient is a 44 year old female scheduled for the above procedure.  History includes never smoker, post-operative N/V, Bipolar disorder, ADHD, anxiety, migraines, childhood asthma, cholecystectomy (2015). BMI is documented as 26, consistent with super morbid obesity.   Presurgical COVID-19 test was negative on 05/03/19.  Anesthesia team to evaluate on the day of surgery. She is for urine pregnancy test on arrival.   VS: BP 103/70   Pulse 85   Temp 36.7 C (Oral)   Resp 20   Ht 5\' 3"  (1.6 m)   Wt (!) 143.6 kg   LMP 05/02/2019   SpO2 98%   BMI 56.07 kg/m   PROVIDERS: Providers outlined per PAT RN visit: PCP - Dr. 05/04/2019 [place], [place]). PAT RN requested last office note, but records still pending.  Cardiologist - [facility] - [place], FNP [facility] - Deatra Robinson, PA-C   LABS: Labs reviewed: Acceptable for surgery. (all labs ordered are listed, but only abnormal results are displayed)  Labs Reviewed  BASIC METABOLIC PANEL  CBC    EKG: N/A   CV: Denied prior stress test, echo, cardiac cath   Past Medical History:  Diagnosis Date  . ADHD (attention deficit hyperactivity disorder)   . Anxiety   . Asthma    05/03/2019: per patient "as a child but grew out of it"  . Bipolar disorder (HCC)    05/03/2019: per patient " diagnosed since 2017'  . Depression   . Migraine headache   . PONV (postoperative nausea and vomiting)    05/03/2019: per patient for a gallbladder surgery in 2015/2016 threw up constantly"    Past Surgical History:  Procedure Laterality Date  . BREAST SURGERY     breast biopsy  .  CHOLECYSTECTOMY     05/03/2019: per patient "back in 2015/2016 at [facility]"    MEDICATIONS: . ALPRAZolam (XANAX) 1 MG tablet  . cholestyramine (QUESTRAN) 4 g packet  . etonogestrel-ethinyl estradiol (NUVARING) 0.12-0.015 MG/24HR vaginal ring  . lamoTRIgine (LAMICTAL) 150 MG tablet  . lisdexamfetamine (VYVANSE) 60 MG capsule  . loperamide (IMODIUM A-D) 2 MG tablet  . ondansetron (ZOFRAN) 4 MG tablet  . traZODone (DESYREL) 100 MG tablet  . venlafaxine XR (EFFEXOR-XR) 150 MG 24 hr capsule   No current facility-administered medications for this encounter.   01-29-1972 ON 05/07/2019] ceFAZolin (ANCEF) 3 g in dextrose 5 % 50 mL IVPB    07/07/2019, PA-C Surgical Short Stay/Anesthesiology [facility] Phone [phone] [facility] Phone [phone] 05/06/2019 10:58 AM

## 2019-05-07 ENCOUNTER — Ambulatory Visit (HOSPITAL_COMMUNITY)
Admission: RE | Admit: 2019-05-07 | Discharge: 2019-05-07 | Disposition: A | Discharge status: Home / Self Care | Payer: BC Managed Care – PPO | Attending: Orthopedic Surgery | Admitting: Orthopedic Surgery

## 2019-05-07 ENCOUNTER — Encounter (HOSPITAL_COMMUNITY): Payer: Self-pay | Admitting: Orthopedic Surgery

## 2019-05-07 ENCOUNTER — Encounter (HOSPITAL_COMMUNITY)
Admission: RE | Disposition: A | Discharge status: Home / Self Care | Payer: Self-pay | Source: Home / Self Care | Attending: Orthopedic Surgery

## 2019-05-07 ENCOUNTER — Ambulatory Visit (HOSPITAL_COMMUNITY): Payer: BC Managed Care – PPO | Admitting: Physician Assistant

## 2019-05-07 ENCOUNTER — Other Ambulatory Visit: Payer: Self-pay

## 2019-05-07 ENCOUNTER — Ambulatory Visit (HOSPITAL_COMMUNITY): Payer: BC Managed Care – PPO | Admitting: Anesthesiology

## 2019-05-07 DIAGNOSIS — M958 Other specified acquired deformities of musculoskeletal system: Secondary | ICD-10-CM | POA: Insufficient documentation

## 2019-05-07 DIAGNOSIS — M7730 Calcaneal spur, unspecified foot: Secondary | ICD-10-CM | POA: Insufficient documentation

## 2019-05-07 DIAGNOSIS — Z6841 Body Mass Index (BMI) 40.0 and over, adult: Secondary | ICD-10-CM | POA: Diagnosis not present

## 2019-05-07 DIAGNOSIS — Z793 Long term (current) use of hormonal contraceptives: Secondary | ICD-10-CM | POA: Insufficient documentation

## 2019-05-07 DIAGNOSIS — F319 Bipolar disorder, unspecified: Secondary | ICD-10-CM | POA: Insufficient documentation

## 2019-05-07 DIAGNOSIS — F909 Attention-deficit hyperactivity disorder, unspecified type: Secondary | ICD-10-CM | POA: Diagnosis not present

## 2019-05-07 DIAGNOSIS — M7661 Achilles tendinitis, right leg: Secondary | ICD-10-CM | POA: Diagnosis not present

## 2019-05-07 DIAGNOSIS — Z79899 Other long term (current) drug therapy: Secondary | ICD-10-CM | POA: Diagnosis not present

## 2019-05-07 DIAGNOSIS — J45909 Unspecified asthma, uncomplicated: Secondary | ICD-10-CM | POA: Insufficient documentation

## 2019-05-07 DIAGNOSIS — F419 Anxiety disorder, unspecified: Secondary | ICD-10-CM | POA: Diagnosis not present

## 2019-05-07 HISTORY — PX: EXCISION HAGLUND'S DEFORMITY WITH ACHILLES TENDON REPAIR: SHX5627

## 2019-05-07 LAB — POCT PREGNANCY, URINE: Preg Test, Ur: NEGATIVE

## 2019-05-07 SURGERY — EXCISION HAGLUND'S DEFORMITY WITH ACHILLES TENDON REPAIR
Anesthesia: General | Laterality: Right

## 2019-05-07 MED ORDER — DEXAMETHASONE SODIUM PHOSPHATE 10 MG/ML IJ SOLN
INTRAMUSCULAR | Status: DC | PRN
  Administered 2019-05-07: 10 mg via INTRAVENOUS

## 2019-05-07 MED ORDER — MEPERIDINE HCL 25 MG/ML IJ SOLN: 6.2500 mg | INTRAMUSCULAR | Status: DC | PRN

## 2019-05-07 MED ORDER — LACTATED RINGERS IV SOLN: INTRAVENOUS | Status: DC | PRN

## 2019-05-07 MED ORDER — ROPIVACAINE HCL 7.5 MG/ML IJ SOLN
INTRAMUSCULAR | Status: DC | PRN
  Administered 2019-05-07: 20 mL via PERINEURAL

## 2019-05-07 MED ORDER — MIDAZOLAM HCL 2 MG/2ML IJ SOLN: 0.5000 mg | Freq: Once | INTRAMUSCULAR | Status: DC | PRN

## 2019-05-07 MED ORDER — 0.9 % SODIUM CHLORIDE (POUR BTL) OPTIME
TOPICAL | Status: DC | PRN
  Administered 2019-05-07: 1000 mL

## 2019-05-07 MED ORDER — VANCOMYCIN HCL 500 MG IV SOLR
INTRAVENOUS | Status: AC
  Filled 2019-05-07: qty 500

## 2019-05-07 MED ORDER — MIDAZOLAM HCL 5 MG/5ML IJ SOLN
INTRAMUSCULAR | Status: DC | PRN
  Administered 2019-05-07: 2 mg via INTRAVENOUS

## 2019-05-07 MED ORDER — MIDAZOLAM HCL 2 MG/2ML IJ SOLN
INTRAMUSCULAR | Status: AC
  Filled 2019-05-07: qty 2

## 2019-05-07 MED ORDER — ONDANSETRON HCL 4 MG/2ML IJ SOLN
INTRAMUSCULAR | Status: DC | PRN
  Administered 2019-05-07: 4 mg via INTRAVENOUS

## 2019-05-07 MED ORDER — SODIUM CHLORIDE 0.9 % IV SOLN: INTRAVENOUS | Status: DC

## 2019-05-07 MED ORDER — OXYCODONE HCL 5 MG PO TABS
ORAL_TABLET | ORAL | Status: AC
  Filled 2019-05-07: qty 1

## 2019-05-07 MED ORDER — HYDROMORPHONE HCL 1 MG/ML IJ SOLN: 0.2500 mg | INTRAMUSCULAR | Status: DC | PRN

## 2019-05-07 MED ORDER — FENTANYL CITRATE (PF) 250 MCG/5ML IJ SOLN
INTRAMUSCULAR | Status: AC
  Filled 2019-05-07: qty 5

## 2019-05-07 MED ORDER — PROPOFOL 10 MG/ML IV BOLUS
INTRAVENOUS | Status: AC
  Filled 2019-05-07: qty 40

## 2019-05-07 MED ORDER — ONDANSETRON HCL 4 MG/2ML IJ SOLN
INTRAMUSCULAR | Status: AC
  Filled 2019-05-07: qty 2

## 2019-05-07 MED ORDER — ROCURONIUM BROMIDE 10 MG/ML (PF) SYRINGE
PREFILLED_SYRINGE | INTRAVENOUS | Status: DC | PRN
  Administered 2019-05-07: 70 mg via INTRAVENOUS

## 2019-05-07 MED ORDER — OXYCODONE HCL 5 MG/5ML PO SOLN: 5.0000 mg | Freq: Once | ORAL | Status: AC | PRN

## 2019-05-07 MED ORDER — OXYCODONE HCL 5 MG PO TABS: 5.0000 mg | ORAL_TABLET | ORAL | 0 refills | Status: AC | PRN

## 2019-05-07 MED ORDER — LIDOCAINE 2% (20 MG/ML) 5 ML SYRINGE
INTRAMUSCULAR | Status: AC
  Filled 2019-05-07: qty 5

## 2019-05-07 MED ORDER — DOCUSATE SODIUM 100 MG PO CAPS: 100.0000 mg | ORAL_CAPSULE | Freq: Every day | ORAL | 2 refills | Status: AC | PRN

## 2019-05-07 MED ORDER — ACETAMINOPHEN 500 MG PO TABS
1000.0000 mg | ORAL_TABLET | Freq: Once | ORAL | Status: AC
  Administered 2019-05-07: 1000 mg via ORAL
  Filled 2019-05-07: qty 2

## 2019-05-07 MED ORDER — HYDROMORPHONE HCL 1 MG/ML IJ SOLN
INTRAMUSCULAR | Status: AC
  Filled 2019-05-07: qty 1

## 2019-05-07 MED ORDER — PROPOFOL 10 MG/ML IV BOLUS
INTRAVENOUS | Status: DC | PRN
  Administered 2019-05-07: 50 mg via INTRAVENOUS
  Administered 2019-05-07: 250 mg via INTRAVENOUS

## 2019-05-07 MED ORDER — SUGAMMADEX SODIUM 200 MG/2ML IV SOLN
INTRAVENOUS | Status: DC | PRN
  Administered 2019-05-07: 300 mg via INTRAVENOUS

## 2019-05-07 MED ORDER — DEXAMETHASONE SODIUM PHOSPHATE 10 MG/ML IJ SOLN
INTRAMUSCULAR | Status: AC
  Filled 2019-05-07: qty 1

## 2019-05-07 MED ORDER — VANCOMYCIN HCL 500 MG IV SOLR
INTRAVENOUS | Status: DC | PRN
  Administered 2019-05-07: 500 mg via TOPICAL

## 2019-05-07 MED ORDER — BUPIVACAINE-EPINEPHRINE (PF) 0.5% -1:200000 IJ SOLN
INTRAMUSCULAR | Status: DC | PRN
  Administered 2019-05-07: 30 mL via PERINEURAL

## 2019-05-07 MED ORDER — PROMETHAZINE HCL 25 MG/ML IJ SOLN: 6.2500 mg | INTRAMUSCULAR | Status: DC | PRN

## 2019-05-07 MED ORDER — FENTANYL CITRATE (PF) 100 MCG/2ML IJ SOLN
INTRAMUSCULAR | Status: DC | PRN
  Administered 2019-05-07 (×5): 50 ug via INTRAVENOUS

## 2019-05-07 MED ORDER — LIDOCAINE 2% (20 MG/ML) 5 ML SYRINGE
INTRAMUSCULAR | Status: DC | PRN
  Administered 2019-05-07: 40 mg via INTRAVENOUS

## 2019-05-07 MED ORDER — OXYCODONE HCL 5 MG PO TABS
5.0000 mg | ORAL_TABLET | Freq: Once | ORAL | Status: AC | PRN
  Administered 2019-05-07: 5 mg via ORAL

## 2019-05-07 MED ORDER — SENNA 8.6 MG PO TABS: 2.0000 | ORAL_TABLET | Freq: Two times a day (BID) | ORAL | 0 refills | Status: AC

## 2019-05-07 MED ORDER — ROCURONIUM BROMIDE 10 MG/ML (PF) SYRINGE
PREFILLED_SYRINGE | INTRAVENOUS | Status: AC
  Filled 2019-05-07: qty 10

## 2019-05-07 MED ORDER — ASPIRIN EC 81 MG PO TBEC: 81.0000 mg | DELAYED_RELEASE_TABLET | Freq: Two times a day (BID) | ORAL | 0 refills | Status: AC

## 2019-05-07 MED ORDER — SCOPOLAMINE 1 MG/3DAYS TD PT72
1.0000 | MEDICATED_PATCH | Freq: Once | TRANSDERMAL | Status: DC
  Administered 2019-05-07: 1.5 mg via TRANSDERMAL
  Filled 2019-05-07: qty 1

## 2019-05-07 SURGICAL SUPPLY — 59 items
ANCHOR JUGGERKNOT WTAP NDL 2.9 (Anchor) ×6 IMPLANT
ANCHOR KNTLS VENTIX 4.75 (Anchor) ×6 IMPLANT
BANDAGE ESMARK 6X9 LF (GAUZE/BANDAGES/DRESSINGS) ×1 IMPLANT
BIT DRILL JUGRKNT W/NDL BIT2.9 (DRILL) ×1 IMPLANT
BLADE LONG MED 31MMX9MM (MISCELLANEOUS) ×1
BLADE LONG MED 31X9 (MISCELLANEOUS) ×2 IMPLANT
BLADE SURG 10 STRL SS (BLADE) ×3 IMPLANT
BNDG COHESIVE 4X5 TAN STRL (GAUZE/BANDAGES/DRESSINGS) ×3 IMPLANT
BNDG COHESIVE 6X5 TAN STRL LF (GAUZE/BANDAGES/DRESSINGS) ×3 IMPLANT
BNDG ESMARK 6X9 LF (GAUZE/BANDAGES/DRESSINGS) ×3
CANISTER SUCT 3000ML PPV (MISCELLANEOUS) ×3 IMPLANT
CHLORAPREP W/TINT 26 (MISCELLANEOUS) ×3 IMPLANT
COVER SURGICAL LIGHT HANDLE (MISCELLANEOUS) ×3 IMPLANT
COVER WAND RF STERILE (DRAPES) ×3 IMPLANT
CUFF TOURN SGL QUICK 34 (TOURNIQUET CUFF)
CUFF TOURN SGL QUICK 42 (TOURNIQUET CUFF) ×3 IMPLANT
CUFF TRNQT CYL 34X4.125X (TOURNIQUET CUFF) IMPLANT
DRAPE U-SHAPE 47X51 STRL (DRAPES) ×3 IMPLANT
DRILL JUGGERKNOT W/NDL BIT 2.9 (DRILL) ×3
DRSG MEPITEL 3X4 ME34 (GAUZE/BANDAGES/DRESSINGS) ×3 IMPLANT
DRSG PAD ABDOMINAL 8X10 ST (GAUZE/BANDAGES/DRESSINGS) ×6 IMPLANT
ELECT REM PT RETURN 9FT ADLT (ELECTROSURGICAL) ×3
ELECTRODE REM PT RTRN 9FT ADLT (ELECTROSURGICAL) ×1 IMPLANT
GAUZE SPONGE 4X4 12PLY STRL (GAUZE/BANDAGES/DRESSINGS) ×3 IMPLANT
GLOVE BIO SURGEON STRL SZ7 (GLOVE) ×6 IMPLANT
GLOVE BIO SURGEON STRL SZ8 (GLOVE) ×3 IMPLANT
GLOVE BIOGEL PI IND STRL 7.5 (GLOVE) ×1 IMPLANT
GLOVE BIOGEL PI IND STRL 8 (GLOVE) ×2 IMPLANT
GLOVE BIOGEL PI INDICATOR 7.5 (GLOVE) ×2
GLOVE BIOGEL PI INDICATOR 8 (GLOVE) ×4
GLOVE ECLIPSE 8.0 STRL XLNG CF (GLOVE) ×3 IMPLANT
GOWN STRL REUS W/ TWL LRG LVL3 (GOWN DISPOSABLE) ×2 IMPLANT
GOWN STRL REUS W/ TWL XL LVL3 (GOWN DISPOSABLE) ×1 IMPLANT
GOWN STRL REUS W/TWL LRG LVL3 (GOWN DISPOSABLE) ×4
GOWN STRL REUS W/TWL XL LVL3 (GOWN DISPOSABLE) ×2
KIT BASIN OR (CUSTOM PROCEDURE TRAY) ×3 IMPLANT
KIT TURNOVER KIT B (KITS) ×3 IMPLANT
NS IRRIG 1000ML POUR BTL (IV SOLUTION) ×3 IMPLANT
PACK ORTHO EXTREMITY (CUSTOM PROCEDURE TRAY) ×3 IMPLANT
PAD ABD 8X10 STRL (GAUZE/BANDAGES/DRESSINGS) ×3 IMPLANT
PAD ARMBOARD 7.5X6 YLW CONV (MISCELLANEOUS) ×6 IMPLANT
PAD CAST 4YDX4 CTTN HI CHSV (CAST SUPPLIES) ×1 IMPLANT
PADDING CAST COTTON 4X4 STRL (CAST SUPPLIES) ×2
PADDING CAST COTTON 6X4 STRL (CAST SUPPLIES) ×3 IMPLANT
SOAP 2 % CHG 4 OZ (WOUND CARE) ×3 IMPLANT
SPONGE LAP 18X18 RF (DISPOSABLE) ×3 IMPLANT
SUCTION FRAZIER HANDLE 10FR (MISCELLANEOUS) ×2
SUCTION TUBE FRAZIER 10FR DISP (MISCELLANEOUS) ×1 IMPLANT
SUT ETHILON 3 0 PS 1 (SUTURE) ×3 IMPLANT
SUT MNCRL AB 3-0 PS2 18 (SUTURE) ×3 IMPLANT
SUT PROLENE 3 0 PS 2 (SUTURE) IMPLANT
SUT VIC AB 2-0 FS1 27 (SUTURE) ×3 IMPLANT
SUT VIC AB 3-0 PS2 18 (SUTURE)
SUT VIC AB 3-0 PS2 18XBRD (SUTURE) IMPLANT
TOWEL GREEN STERILE (TOWEL DISPOSABLE) ×3 IMPLANT
TOWEL GREEN STERILE FF (TOWEL DISPOSABLE) ×3 IMPLANT
TUBE CONNECTING 12'X1/4 (SUCTIONS) ×1
TUBE CONNECTING 12X1/4 (SUCTIONS) ×2 IMPLANT
WATER STERILE IRR 1000ML POUR (IV SOLUTION) ×3 IMPLANT

## 2019-05-07 NOTE — Anesthesia Procedure Notes (Signed)
Anesthesia Regional Block: Popliteal block   Pre-Anesthetic Checklist: ,, timeout performed, Correct Patient, Correct Site, Correct Laterality, Correct Procedure, Correct Position, site marked, Risks and benefits discussed,  Surgical consent,  Pre-op evaluation,  At surgeon's request and post-op pain management  Laterality: Right and Lower  Prep: chloraprep       Needles:  Injection technique: Single-shot  Needle Type: Echogenic Stimulator Needle     Needle Length: 9cm  Needle Gauge: 21     Additional Needles:   Procedures:, nerve stimulator,,, ultrasound used (permanent image in chart),,,,   Nerve Stimulator or Paresthesia:  Response: toe twitches, 0.4 mA, 0.1 ms,   Additional Responses:   Narrative:  Start time: 05/07/2019 7:23 AM End time: 05/07/2019 7:28 AM Injection made incrementally with aspirations every 5 mL.  Performed by: Personally  Anesthesiologist: Jairo Ben, MD  Additional Notes: Pt identified in Holding room.  Monitors applied. Working IV access confirmed. Sterile prep R lateral knee.  #21ga ECHOgenic PNS needle to toe twitches with US guidance.  30cc 0.5% Bupivacaine with 1:200k epi injected incrementally after negative test dose.  Patient asymptomatic, VSS, no heme aspirated, tolerated well.  Sandford Craze, MD

## 2019-05-07 NOTE — Anesthesia Postprocedure Evaluation (Signed)
Anesthesia Post Note  Patient: Linda Zuniga  Procedure(s) Performed: Right gastroc recession, haglund excision, Achiles tendon reconstruction (Right )     Patient location during evaluation: PACU Anesthesia Type: General Level of consciousness: awake and alert, patient cooperative and oriented Pain management: pain level controlled Vital Signs Assessment: post-procedure vital signs reviewed and stable Respiratory status: spontaneous breathing, nonlabored ventilation and respiratory function stable Cardiovascular status: blood pressure returned to baseline and stable Postop Assessment: no apparent nausea or vomiting Anesthetic complications: no    Last Vitals:  Vitals:   05/07/19 0915 05/07/19 0930  BP: (!) 151/90 136/75  Pulse: (!) 108 100  Resp: 15 15  Temp:  36.9 C  SpO2: 100% 97%    Last Pain:  Vitals:   05/07/19 0900  TempSrc:   PainSc: 7                  Lovett Coffin,E. Latrice Storlie

## 2019-05-07 NOTE — Op Note (Signed)
05/07/2019  8:38 AM  PATIENT:  Linda Zuniga  44 y.o. female  PRE-OPERATIVE DIAGNOSIS: 1.  Right Achilles insertional tendinopathy 2.  Right calcaneus Haglund deformity 3.  Short right Achilles tendon  POST-OPERATIVE DIAGNOSIS: Same  Procedure(s): 1.  Right gastrocnemius recession through separate incision 2.  Excision of right calcaneus Haglund deformity 3.  Right Achilles tendon debridement and reconstruction  SURGEON:  Toni Arthurs, MD  ASSISTANT: Alfredo Martinez, PA-C  ANESTHESIA:   General, regional  EBL:  minimal   TOURNIQUET:   Total Tourniquet Time Documented: Thigh (Right) - 48 minutes Total: Thigh (Right) - 48 minutes  COMPLICATIONS:  None apparent  DISPOSITION:  Extubated, awake and stable to recovery.  INDICATION FOR PROCEDURE: The patient is a 44 year old female with a past medical history significant for morbid obesity.  She has a long history of right heel pain due to insertional Achilles tendinopathy.  She has a tight heel cord and Haglund deformity as well.  She has failed nonoperative treatment to date and presents today for gastroc recession, Achilles tendon debridement and reconstruction with excision of Haglund deformity.  The risks and benefits of the alternative treatment options have been discussed in detail.  The patient wishes to proceed with surgery and specifically understands risks of bleeding, infection, nerve damage, blood clots, need for additional surgery, amputation and death.  PROCEDURE IN DETAIL: After preoperative consent was obtained and the correct operative site was identified, the patient was brought to the operating room supine on stretcher.  Preoperative antibiotics were administered.  General anesthesia was administered.  A surgical timeout was taken.  The right lower extremity was exsanguinated and a thigh tourniquet inflated to 350 mmHg.  The patient was then turned into the prone position on the operating table with all bony prominences padded  well.  The right lower extremity was prepped and draped in standard sterile fashion.  A longitudinal incision was made over the calf.  Dissection was carried down through the subcutaneous tissues.  Care was taken to protect the lesser saphenous vein and sural nerve.  The gastrocnemius tendon was identified.  It was divided in its entirety under direct vision.  The plantaris tendon was also divided.  The wound was irrigated and sprinkled with vancomycin powder.  Subcutaneous tissues were approximated with Monocryl and the skin incision was closed with nylon.  Attention was turned to the posterior aspect of the heel where a longitudinal incision was made.  Dissection was carried down through the subcutaneous tissue sharply to create full-thickness flaps medially and laterally.  The peritenon was incised exposing the Achilles tendon.  The Achilles was split longitudinally and released from its insertion on the calcaneus medially and laterally.  A prominent posterior enthesophyte was identified along with the Haglund deformity.  The oscillating saw was used to remove both bony prominences.  The Achilles tendon was then sharply debrided with a scalpel of all degenerated tendon.  The wound was irrigated copiously.  The tendon reconstruction was then performed by attaching the tendon to the cut surface of bone with 4 suture anchors and an hourglass pattern of nonabsorbable suture.  The ankle could then be dorsiflexed to neutral with no gapping of the tendon repair site.  The wound was again irrigated and sprinkled with vancomycin powder.  The peritenon and subcutaneous tissues were approximated with inverted simple sutures of 2-0 Vicryl.  Skin incision was closed with horizontal mattress sutures of 3-0 nylon.  Sterile dressings were applied followed by a well-padded short leg splint.  Tourniquet was released after application of the dressings.  The patient was awakened from anesthesia and transported to the recovery room  in stable condition.   FOLLOW UP PLAN: Nonweightbearing on the right lower extremity for the next 2 weeks.  Follow-up in the office for suture removal and conversion to a cam boot.  Aspirin for DVT prophylaxis.    [name] PA-C was present and scrubbed for the duration of the operative case. His assistance was essential in positioning the patient, prepping and draping, gaining and maintaining exposure, performing the operation, closing and dressing the wounds and applying the splint.

## 2019-05-07 NOTE — Anesthesia Procedure Notes (Signed)
Procedure Name: Intubation Date/Time: 05/07/2019 7:42 AM Performed by: Lovie Chol, CRNA Pre-anesthesia Checklist: Patient identified, Emergency Drugs available, Suction available and Patient being monitored Patient Re-evaluated:Patient Re-evaluated prior to induction Oxygen Delivery Method: Circle System Utilized Preoxygenation: Pre-oxygenation with 100% oxygen Induction Type: IV induction Ventilation: Mask ventilation without difficulty Tube type: Oral Tube size: 7.5 mm Number of attempts: 1 Airway Equipment and Method: Stylet and Oral airway Placement Confirmation: ETT inserted through vocal cords under direct vision,  positive ETCO2 and breath sounds checked- equal and bilateral Secured at: 21 cm Tube secured with: Tape Dental Injury: Teeth and Oropharynx as per pre-operative assessment

## 2019-05-07 NOTE — H&P (Signed)
Linda Zuniga is an 44 y.o. female.   Chief Complaint:  Right heel pain HPI: The patient is a 44 year old female with a past medical history significant for morbid obesity.  She has a long history of right heel pain from Achilles tendinitis.  She has failed nonoperative treatment including activity modification, oral anti-inflammatories, physical therapy and shoewear modification.  She presents today for surgical treatment.  Past Medical History:  Diagnosis Date  . ADHD (attention deficit hyperactivity disorder)   . Anxiety   . Asthma    05/03/2019: per patient "as a child but grew out of it"  . Bipolar disorder (HCC)    05/03/2019: per patient " diagnosed since 2017'  . Depression   . Migraine headache   . PONV (postoperative nausea and vomiting)    05/03/2019: per patient for a gallbladder surgery in 2015/2016 threw up constantly"    Past Surgical History:  Procedure Laterality Date  . BREAST SURGERY     breast biopsy  . CHOLECYSTECTOMY     05/03/2019: per patient "back in 2015/2016 at [facility]"    History reviewed. No pertinent family history. Social History:  reports that she has never smoked. She has never used smokeless tobacco. She reports current alcohol use. She reports that she does not use drugs.  Allergies:  Allergies  Allergen Reactions  . Codeine Rash    Medications Prior to Admission  Medication Sig Dispense Refill  . ALPRAZolam (XANAX) 1 MG tablet Take 1 mg by mouth 3 (three) times daily.    . cholestyramine (QUESTRAN) 4 g packet Take 4 g by mouth daily.    Marland Kitchen etonogestrel-ethinyl estradiol (NUVARING) 0.12-0.015 MG/24HR vaginal ring Place 1 each vaginally every 28 (twenty-eight) days. Insert vaginally and leave in place for 3 consecutive weeks, then remove for 1 week.    . lamoTRIgine (LAMICTAL) 150 MG tablet Take 300 mg by mouth daily.    Marland Kitchen lisdexamfetamine (VYVANSE) 60 MG capsule Take 60 mg by mouth every morning.    . traZODone (DESYREL) 100 MG tablet Take  100-300 mg by mouth at bedtime.    Marland Kitchen venlafaxine XR (EFFEXOR-XR) 150 MG 24 hr capsule Take 300 mg by mouth daily with breakfast.    . loperamide (IMODIUM A-D) 2 MG tablet Take 2 mg by mouth 4 (four) times daily as needed. Diarrhea    . ondansetron (ZOFRAN) 4 MG tablet Take 1 tablet (4 mg total) by mouth every 8 (eight) hours as needed for nausea. 6 tablet 0    Results for orders placed or performed during the hospital encounter of 05/07/19 (from the past 48 hour(s))  Pregnancy, urine POC     Status: None   Collection Time: 05/07/19  6:04 AM  Result Value Ref Range   Preg Test, Ur NEGATIVE NEGATIVE    Comment:        THE SENSITIVITY OF THIS METHODOLOGY IS >24 mIU/mL    No results found.  Review of Systems no recent fever, chills, nausea, vomiting or changes in her appetite  Blood pressure (!) 119/52, pulse 93, temperature 98.3 F (36.8 C), temperature source Oral, resp. rate 20, height 5\' 3"  (1.6 m), weight (!) 143.8 kg, last menstrual period 05/02/2019, SpO2 97 %. Physical Exam  Well-nourished well-developed woman in no apparent distress.  Alert and oriented x4.  Normal mood and affect.  Right foot has healthy skin and palpable pulses.  Heel cord is tight.  No lymphadenopathy.  Pulses are palpable in the foot.  5 out of  5 strength in plantarflexion of the ankle.  Tender to palpation at the insertion of the Achilles in the posterior calcaneus.  Assessment/Plan Right Achilles insertional tendinopathy, Haglund deformity and tight heel cord -to the operating room for gastrocnemius recession, excision of the Haglund deformity and debridement of the Achilles tendon with reconstruction.  The risks and benefits of the alternative treatment options have been discussed in detail.  The patient wishes to proceed with surgery and specifically understands risks of bleeding, infection, nerve damage, blood clots, need for additional surgery, amputation and death.   Wylene Simmer, MD 06-03-2019, 7:18  AM

## 2019-05-07 NOTE — Transfer of Care (Signed)
Immediate Anesthesia Transfer of Care Note  Patient: Linda Zuniga  Procedure(s) Performed: Right gastroc recession, haglund excision, Achiles tendon reconstruction (Right )  Patient Location: PACU  Anesthesia Type:General  Level of Consciousness: awake, oriented and patient cooperative  Airway & Oxygen Therapy: Patient Spontanous Breathing and Patient connected to nasal cannula oxygen  Post-op Assessment: Report given to RN and Post -op Vital signs reviewed and stable  Post vital signs: Reviewed  Last Vitals:  Vitals Value Taken Time  BP 123/57 05/07/19 0900  Temp    Pulse 118 05/07/19 0901  Resp 18 05/07/19 0901  SpO2 97 % 05/07/19 0901  Vitals shown include unvalidated device data.  Last Pain:  Vitals:   05/07/19 0618  TempSrc:   PainSc: 3       Patients Stated Pain Goal: 6 (05/07/19 8338)  Complications: No apparent anesthesia complications

## 2019-05-07 NOTE — Anesthesia Procedure Notes (Signed)
Anesthesia Regional Block: Adductor canal block   Pre-Anesthetic Checklist: ,, timeout performed, Correct Patient, Correct Site, Correct Laterality, Correct Procedure, Correct Position, site marked, Risks and benefits discussed,  Surgical consent,  Pre-op evaluation,  At surgeon's request and post-op pain management  Laterality: Right and Lower  Prep: chloraprep       Needles:  Injection technique: Single-shot  Needle Type: Echogenic Needle     Needle Length: 9cm  Needle Gauge: 21     Additional Needles:   Procedures:,,,, ultrasound used (permanent image in chart),,,,  Narrative:  Start time: 05/07/2019 7:15 AM End time: 05/07/2019 7:22 AM Injection made incrementally with aspirations every 5 mL.  Performed by: Personally  Anesthesiologist: Jairo Ben, MD  Additional Notes: Pt identified in Holding room.  Monitors applied. Working IV access confirmed. Sterile prep R thigh.  #21ga ECHOgenic needle into adductor canal with US guidance.  20cc 0.75% Ropivacaine injected incrementally after negative test dose.  Patient asymptomatic, VSS, no heme aspirated, tolerated well.  Sandford Craze, MD

## 2019-05-07 NOTE — Discharge Instructions (Signed)
Linda Hewitt, MD [facility]  Please read the following information regarding your care after surgery.  Medications  You only need a prescription for the narcotic pain medicine (ex. oxycodone, Percocet, Norco).  All of the other medicines listed below are available over the counter. X Aleve 2 pills twice a day for the first 3 days after surgery. X acetominophen (Tylenol) 650 mg every 4-6 hours as you need for minor to moderate pain X oxycodone as prescribed for severe pain  Narcotic pain medicine (ex. oxycodone, Percocet, Vicodin) will cause constipation.  To prevent this problem, take the following medicines while you are taking any pain medicine. X docusate sodium (Colace) 100 mg twice a day X senna (Senokot) 2 tablets twice a day  X To help prevent blood clots, take a baby aspirin (81 mg) twice a day for two weeks after surgery.  You should also get up every hour while you are awake to move around.    Weight Bearing X Do not bear any weight on the operated leg or foot.  Cast / Splint / Dressing X Keep your splint, cast or dressing clean and dry.  Don't put anything (coat hanger, pencil, etc) down inside of it.  If it gets damp, use a hair dryer on the cool setting to dry it.  If it gets soaked, call the office to schedule an appointment for a cast change.   After your dressing, cast or splint is removed; you may shower, but do not soak or scrub the wound.  Allow the water to run over it, and then gently pat it dry.  Swelling It is normal for you to have swelling where you had surgery.  To reduce swelling and pain, keep your toes above your nose for at least 3 days after surgery.  It may be necessary to keep your foot or leg elevated for several weeks.  If it hurts, it should be elevated.  Follow Up Call my office at [phone] when you are discharged from the hospital or surgery center to schedule an appointment to be seen two weeks after surgery.  Call my office at [phone] if  you develop a fever >101.5 F, nausea, vomiting, bleeding from the surgical site or severe pain.
